# Patient Record
Sex: Male | Born: 1975
Health system: Southern US, Community
[De-identification: ages and names within clinical notes are randomized; demographics above are authoritative.]

## PROBLEM LIST (undated history)

## (undated) DIAGNOSIS — I10 Essential (primary) hypertension: Secondary | ICD-10-CM

## (undated) DIAGNOSIS — I219 Acute myocardial infarction, unspecified: Secondary | ICD-10-CM

## (undated) DIAGNOSIS — E785 Hyperlipidemia, unspecified: Secondary | ICD-10-CM

## (undated) HISTORY — PX: CARDIAC CATHETERIZATION: SHX172

---

## 2000-03-15 ENCOUNTER — Emergency Department (HOSPITAL_COMMUNITY): Admission: EM | Admit: 2000-03-15 | Discharge: 2000-03-15 | Payer: Self-pay | Admitting: Emergency Medicine

## 2000-03-15 ENCOUNTER — Encounter: Payer: Self-pay | Admitting: Emergency Medicine

## 2003-11-16 ENCOUNTER — Ambulatory Visit (HOSPITAL_COMMUNITY): Admission: RE | Admit: 2003-11-16 | Discharge: 2003-11-16 | Payer: Self-pay | Admitting: Urology

## 2020-06-14 ENCOUNTER — Emergency Department (HOSPITAL_BASED_OUTPATIENT_CLINIC_OR_DEPARTMENT_OTHER)
Admission: EM | Admit: 2020-06-14 | Discharge: 2020-06-14 | Disposition: A | Discharge status: Home / Self Care | Payer: 59 | Attending: Emergency Medicine | Admitting: Emergency Medicine

## 2020-06-14 ENCOUNTER — Other Ambulatory Visit: Payer: Self-pay

## 2020-06-14 ENCOUNTER — Emergency Department (HOSPITAL_BASED_OUTPATIENT_CLINIC_OR_DEPARTMENT_OTHER): Payer: 59

## 2020-06-14 ENCOUNTER — Encounter (HOSPITAL_BASED_OUTPATIENT_CLINIC_OR_DEPARTMENT_OTHER): Payer: Self-pay | Admitting: *Deleted

## 2020-06-14 DIAGNOSIS — I10 Essential (primary) hypertension: Secondary | ICD-10-CM | POA: Insufficient documentation

## 2020-06-14 DIAGNOSIS — Z79899 Other long term (current) drug therapy: Secondary | ICD-10-CM | POA: Insufficient documentation

## 2020-06-14 DIAGNOSIS — Z7982 Long term (current) use of aspirin: Secondary | ICD-10-CM | POA: Insufficient documentation

## 2020-06-14 DIAGNOSIS — Z23 Encounter for immunization: Secondary | ICD-10-CM | POA: Insufficient documentation

## 2020-06-14 DIAGNOSIS — S60942A Unspecified superficial injury of right middle finger, initial encounter: Secondary | ICD-10-CM | POA: Diagnosis present

## 2020-06-14 DIAGNOSIS — Y92812 Truck as the place of occurrence of the external cause: Secondary | ICD-10-CM | POA: Diagnosis not present

## 2020-06-14 DIAGNOSIS — W231XXA Caught, crushed, jammed, or pinched between stationary objects, initial encounter: Secondary | ICD-10-CM | POA: Insufficient documentation

## 2020-06-14 DIAGNOSIS — S61212A Laceration without foreign body of right middle finger without damage to nail, initial encounter: Secondary | ICD-10-CM | POA: Insufficient documentation

## 2020-06-14 DIAGNOSIS — Y999 Unspecified external cause status: Secondary | ICD-10-CM | POA: Diagnosis not present

## 2020-06-14 DIAGNOSIS — Y9389 Activity, other specified: Secondary | ICD-10-CM | POA: Diagnosis not present

## 2020-06-14 HISTORY — DX: Hyperlipidemia, unspecified: E78.5

## 2020-06-14 HISTORY — DX: Acute myocardial infarction, unspecified: I21.9

## 2020-06-14 HISTORY — DX: Essential (primary) hypertension: I10

## 2020-06-14 MED ORDER — TETANUS-DIPHTHERIA TOXOIDS TD 5-2 LFU IM INJ: 0.5000 mL | INJECTION | Freq: Once | INTRAMUSCULAR | Status: DC

## 2020-06-14 MED ORDER — BACITRACIN ZINC 500 UNIT/GM EX OINT: TOPICAL_OINTMENT | Freq: Two times a day (BID) | CUTANEOUS | Status: DC

## 2020-06-14 MED ORDER — TETANUS-DIPHTH-ACELL PERTUSSIS 5-2.5-18.5 LF-MCG/0.5 IM SUSP
0.5000 mL | Freq: Once | INTRAMUSCULAR | Status: AC
  Administered 2020-06-14: 0.5 mL via INTRAMUSCULAR

## 2020-06-14 MED ORDER — CEPHALEXIN 500 MG PO CAPS
500.0000 mg | ORAL_CAPSULE | Freq: Two times a day (BID) | ORAL | 0 refills | Status: AC
Start: 2020-06-14 — End: 2020-06-19

## 2020-06-14 MED ORDER — TETANUS-DIPHTH-ACELL PERTUSSIS 5-2.5-18.5 LF-MCG/0.5 IM SUSP
INTRAMUSCULAR | Status: AC
  Filled 2020-06-14: qty 0.5

## 2020-06-14 MED FILL — CEPHALEXIN 500 MG CAPSULE: 500 | 5 days supply | Qty: 10 | Fill #0

## 2020-06-14 NOTE — ED Provider Notes (Signed)
[facility] EMERGENCY DEPARTMENT Provider Note   CSN: [id number] Arrival date & time: 06/14/20  1414     History Chief Complaint  Patient presents with  . Finger Injury    Bruce Hines is a 44 y.o. male.  HPI  Patient is a 44 year old male presented today with right third finger pain and laceration.  He states that this occurred 30 minutes prior to arrival emergency department.  He states that he was loading unloading a truck and crushed his finger between 2 rusted metallic objects of approximately 100 pounds of force behind the crush injury.  He states that the pain is improved somewhat now with time and no other intervention.  He denies any loss of sensation in his fingertips.  States he is uncertain when his last tetanus update was.     Past Medical History:  Diagnosis Date  . Hyperlipidemia   . Hypertension   . MI (myocardial infarction) (HCC)     There are no problems to display for this patient.   Past Surgical History:  Procedure Laterality Date  . CARDIAC CATHETERIZATION         No family history on file.  Social History   Tobacco Use  . Smoking status: Never Smoker  . Smokeless tobacco: Never Used  Substance Use Topics  . Alcohol use: Not Currently  . Drug use: Not Currently    Home Medications Prior to Admission medications   Medication Sig Start Date End Date Taking? Authorizing Provider  aspirin 81 MG EC tablet Take 1 tablet by mouth daily. 01/24/20  Yes [provider]  metoprolol tartrate (LOPRESSOR) 25 MG tablet TAKE 1 TABLET(25 MG) BY MOUTH TWICE DAILY 01/24/20  Yes [provider]  atorvastatin (LIPITOR) 80 MG tablet Take 80 mg by mouth daily. 06/06/20   [provider]  cephALEXin (KEFLEX) 500 MG capsule Take 1 capsule (500 mg total) by mouth 2 (two) times daily for 5 days. 06/14/20 06/19/20  Gailen Shelter, PA  levothyroxine (SYNTHROID) 100 MCG tablet Take 100 mcg by mouth daily. 03/27/20   [provider]    Allergies    Patient has no known allergies.  Review of Systems   Review of Systems  Constitutional: Negative for chills and fever.  HENT: Negative for congestion.   Respiratory: Negative for shortness of breath.   Cardiovascular: Negative for chest pain.  Gastrointestinal: Negative for abdominal pain.  Musculoskeletal: Negative for neck pain.       Finger pain  Skin: Positive for wound.       Laceration    Physical Exam Updated Vital Signs BP 112/64   Pulse 76   Temp 98.7 F (37.1 C)   Resp 18   Ht 6' (1.829 m)   Wt 98.9 kg   SpO2 100%   BMI 29.57 kg/m   Physical Exam Vitals and nursing note reviewed.  Constitutional:      General: He is not in acute distress.    Appearance: Normal appearance. He is not ill-appearing.  HENT:     Head: Normocephalic and atraumatic.  Eyes:     General: No scleral icterus.       Right eye: No discharge.        Left eye: No discharge.     Conjunctiva/sclera: Conjunctivae normal.  Cardiovascular:     Rate and Rhythm: Normal rate.  Pulmonary:     Effort: Pulmonary effort is normal.     Breath sounds: No stridor.  Musculoskeletal:  Comments: For range of motion of right middle fingertip  Skin:    General: Skin is warm and dry.     Capillary Refill: Capillary refill takes less than 2 seconds.     Comments: 2 cm laceration to the dorsum of the right middle fingertip pad  Neurological:     Mental Status: He is alert and oriented to person, place, and time. Mental status is at baseline.     Comments: Sensation intact to sharp and dull sensation of the fingertip.  Psychiatric:        Mood and Affect: Mood normal.        Behavior: Behavior normal.     ED Results / Procedures / Treatments   Labs (all labs ordered are listed, but only abnormal results are displayed) Labs Reviewed - No data to display  EKG None  Radiology DG Finger Middle Right  Result Date: 06/14/2020 CLINICAL DATA:  Right middle  finger crush injury. EXAM: RIGHT MIDDLE FINGER 2+V COMPARISON:  None. FINDINGS: There is no evidence of fracture or dislocation. There is no evidence of arthropathy or other focal bone abnormality. Soft tissues are unremarkable. IMPRESSION: Negative. Electronically Signed   By: Lupita Raider M.D.   On: 06/14/2020 14:54    Procedures .Marland KitchenLaceration Repair  Date/Time: 06/14/2020 4:19 PM Performed by: Gailen Shelter, PA Authorized by: Gailen Shelter, PA   Consent:    Consent obtained:  Verbal   Consent given by:  Patient   Risks discussed:  Infection, need for additional repair, pain, poor cosmetic result and poor wound healing   Alternatives discussed:  No treatment and delayed treatment Universal protocol:    Procedure explained and questions answered to patient or proxy's satisfaction: yes     Relevant documents present and verified: yes     Test results available and properly labeled: yes     Imaging studies available: yes     Required blood products, implants, devices, and special equipment available: yes     Site/side marked: yes     Immediately prior to procedure, a time out was called: yes     Patient identity confirmed:  Verbally with patient Anesthesia (see MAR for exact dosages):    Anesthesia method:  Local infiltration   Local anesthetic:  Bupivacaine 0.5% w/o epi Laceration details:    Location:  Finger   Finger location:  R long finger   Length (cm):  2 Repair type:    Repair type:  Simple Exploration:    Hemostasis achieved with:  Direct pressure   Wound exploration: wound explored through full range of motion     Wound extent: no foreign bodies/material noted and no tendon damage noted     Contaminated: no   Treatment:    Area cleansed with:  Saline   Amount of cleaning:  Standard   Irrigation solution:  Sterile saline   Irrigation volume:  200   Irrigation method:  Pressure wash   Visualized foreign bodies/material removed: no   Skin repair:    Repair  method:  Sutures   Suture size:  4-0   Suture material:  Prolene   Suture technique:  Simple interrupted   Number of sutures:  4 Approximation:    Approximation:  Close Post-procedure details:    Dressing:  Antibiotic ointment, non-adherent dressing and bulky dressing   Patient tolerance of procedure:  Tolerated well, no immediate complications   (including critical care time)  Medications Ordered in ED2 Medications  Tdap (BOOSTRIX)  5-2.5-18.5 LF-MCG/0.5 injection (has no administration in time range)  bacitracin ointment (has no administration in time range)  Tdap (BOOSTRIX) injection 0.5 mL (0.5 mLs Intramuscular Given 06/14/20 1432)    ED Course  I have reviewed the triage vital signs and the nursing notes.  Pertinent labs & imaging results that were available during my care of the patient were reviewed by me and considered in my medical decision making (see chart for details).    MDM Rules/Calculators/A&P                          Pressure irrigation performed. Wound explored and base of wound visualized in a bloodless field without evidence of foreign body.  Laceration occurred < 8 hours prior to repair which was well tolerated. Tdap updated.  Pt has no comorbidities to effect normal wound healing. Pt discharged with antibiotics due to dirty wound (even with cleaning) Discussed suture home care with patient and answered questions. Pt to follow-up for wound check and suture removal in 7 days; they are to return to the ED sooner for signs of infection. Pt is hemodynamically stable with no complaints prior to dc.   Final Clinical Impression(s) / ED Diagnoses Final diagnoses:  Laceration of right middle finger without foreign body without damage to nail, initial encounter    Rx / DC Orders ED Discharge Orders         Ordered    cephALEXin (KEFLEX) 500 MG capsule  2 times daily     Discontinue  Reprint     06/14/20 1559           Gailen Shelter, PA 06/14/20 1936      Melene Plan, DO 06/14/20 1944

## 2020-06-14 NOTE — Medical Student Note (Signed)
[facility] Provider Student Note For educational purposes for Medical, PA and NP students only and not part of the legal medical record.   CSN: [id number] Arrival date & time: 06/14/20  1414      History   Chief Complaint Chief Complaint  Patient presents with  . Finger Injury    HPI Bruce Hines is a 44 y.o. male presenting with right third finger laceration. 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 The laceration is about 4 cm in length and at the dorsal aspect of his third finger. There is no obvious bony deformity of the finger or his hand. Pt is currently soaking finger in betadine.  He admits to receiving tetanus shot in ED and does not remember when he received his last tetanus shot prior to today. He says that he has history of MI and takes a daily ASA. He has maintained RoM and sensation in his finger and is in no acute distress.  HPI  Past Medical History:  Diagnosis Date  . Hyperlipidemia   . Hypertension   . MI (myocardial infarction) (HCC)     There are no problems to display for this patient.   Past Surgical History:  Procedure Laterality Date  . CARDIAC CATHETERIZATION         Home Medications    Prior to Admission medications   Not on File    Family History No family history on file.  Social History Social History   Tobacco Use  . Smoking status: Never Smoker  . Smokeless tobacco: Never Used  Substance Use Topics  . Alcohol use: Not Currently  . Drug use: Not Currently     Allergies   Patient has no known allergies.   Review of Systems Review of Systems  Respiratory: Negative for cough and shortness of breath.   Cardiovascular: Negative for chest pain and palpitations.  Skin: Positive for wound (right third finger laceration). Negative for color change, pallor and rash.  Neurological: Negative for syncope and weakness.      Physical Exam Updated Vital Signs BP (!) 161/89   Pulse 76   Temp 98.7 F (37.1 C)   Resp 18   Ht 6' (1.829 m)   Wt 98.9 kg   SpO2 100%   BMI 29.57 kg/m   Physical Exam Constitutional:      General: He is not in acute distress.    Appearance: Normal appearance. He is not ill-appearing or diaphoretic.  HENT:     Head: Normocephalic and atraumatic.  Cardiovascular:     Rate and Rhythm: Normal rate and regular rhythm.     Pulses: Normal pulses.     Heart sounds: Normal heart sounds.  Pulmonary:     Effort: Pulmonary effort is normal.     Breath sounds: Normal breath sounds.  Abdominal:     General: Abdomen is flat.     Palpations: Abdomen is soft.  Musculoskeletal:        General: No swelling. Normal range of motion.     Cervical back: Normal range of motion and neck supple.  Skin:    General: Skin is warm and dry.     Comments: Right third finger laceration about 4 cm in length and dorsal and distal aspect of finger   Neurological:     Mental Status: He is alert and oriented to person, place, and time.  Sensory: No sensory deficit.      ED Treatments / Results  Labs (all labs ordered are listed, but only abnormal results are displayed) Labs Reviewed - No data to display  EKG  Radiology DG Finger Middle Right  Result Date: 06/14/2020 CLINICAL DATA:  Right middle finger crush injury. EXAM: RIGHT MIDDLE FINGER 2+V COMPARISON:  None. FINDINGS: There is no evidence of fracture or dislocation. There is no evidence of arthropathy or other focal bone abnormality. Soft tissues are unremarkable. IMPRESSION: Negative. Electronically Signed   By: Lupita Raider M.D.   On: 06/14/2020 14:54    Procedures .Marland KitchenLaceration Repair  Date/Time: 06/14/2020 3:58 PM Performed by: Pollyann Savoy, MD Authorized by: Pollyann Savoy, MD   Consent:    Consent obtained:  Verbal   Consent given by:  Patient   Risks discussed:  Infection, pain, need for additional  repair and poor wound healing   Alternatives discussed:  No treatment Anesthesia (see MAR for exact dosages):    Anesthesia method:  Local infiltration   Local anesthetic:  Lidocaine 1% w/o epi Laceration details:    Location:  Finger   Finger location:  R long finger   Length (cm):  3   Depth (mm):  3 Repair type:    Repair type:  Simple Pre-procedure details:    Preparation:  Patient was prepped and draped in usual sterile fashion and imaging obtained to evaluate for foreign bodies Exploration:    Hemostasis achieved with:  Epinephrine and tourniquet   Wound extent: no foreign bodies/material noted   Treatment:    Area cleansed with:  Saline and Betadine   Amount of cleaning:  Standard   Irrigation solution:  Sterile saline   Irrigation volume:  2000 cc Skin repair:    Repair method:  Sutures   Suture size:  4-0   Suture material:  Prolene   Suture technique:  Simple interrupted   Number of sutures:  4 Approximation:    Approximation:  Close Post-procedure details:    Dressing:  Antibiotic ointment and adhesive bandage   Patient tolerance of procedure:  Tolerated well, no immediate complications   (including critical care time)  Medications Ordered in ED Medications  Tdap (BOOSTRIX) 5-2.5-18.5 LF-MCG/0.5 injection (has no administration in time range)  Tdap (BOOSTRIX) injection 0.5 mL (0.5 mLs Intramuscular Given 06/14/20 1432)     Initial Impression / Assessment and Plan / ED Course  I have reviewed the triage vital signs and the nursing notes.  Pertinent labs & imaging results that were available during my care of the patient were reviewed by me and considered in my medical decision making (see chart for details).    Radiology report of right middle finger XR reports no acute fracture or dislocation. Pt will receive laceration repair while in ED along with local anesthesia with lidocaine without epi. He will be advised to take tylenol or NSAIDs OTC for pain while  at home.  Pt updated on radiology results negative for fracture and he elected for suture repair of finger. Local wound irrigation with 2000 cc NS and local digital nerve block with lidocaine WO epi. Pt had 4 4-0 prolene sutures placed in simple interrupted fashion and tolerated procedure well w/o complaints. Used tourniquet during procedure and was removed after procedure. Post tourniquet removal pt required direct pressure for hemostasis. The pt will be discharged with short course of Keflex to cover infection given mechanism of injury- dirty work [employer].  Pt will be discharged  home with abx and educated that sutures will need removed in 10-14 days. Pt educated to return to ED if signs of infection are present such as erythema, warmth to touch, purulent discharge.   Final Clinical Impressions(s) / ED Diagnoses   Final diagnoses:  None    New Prescriptions New Prescriptions   No medications on file

## 2020-06-14 NOTE — ED Notes (Signed)
Patient transported to X-ray 

## 2020-06-14 NOTE — ED Triage Notes (Signed)
Pt c/o right 3rd finger crushing injury with lac x 30 mins ago

## 2020-06-14 NOTE — Discharge Instructions (Addendum)
Sutured repair Keep the laceration site dry for the next 24 hours and leave the dressing in place. After 24 hours you may remove the dressing and gently clean the laceration site with antibacterial soap and warm water. Do not scrub the area. Do not soak the area and water for long periods of time. Don't use hydrogen peroxide, iodine-based solutions, or alcohol, which can slow healing, and will probably be painful! Apply topical bacitracin 1-2 times per day for the next 3-5 days. Return to the emergency department in 7-10 days for removal of the sutures.  You should return sooner for any signs of infection which would include increased redness around the wound, increased swelling, new drainage of yellow pus.   I have low suspicion that you have an infection however please take the antibiotic I prescribed you twice daily for the next 5 days.  You may use Tylenol 1000 mg every 6 hours.

## 2020-06-14 NOTE — ED Notes (Signed)
ED Provider at bedside. 

## 2020-12-03 IMAGING — CR DG FINGER MIDDLE 2+V*R*
3 series · 3 of 3 positions shown · non-contrast
Comparison: None.

CLINICAL DATA: Right middle finger crush injury.

EXAM:
RIGHT MIDDLE FINGER 2+V

[x finger pa right]
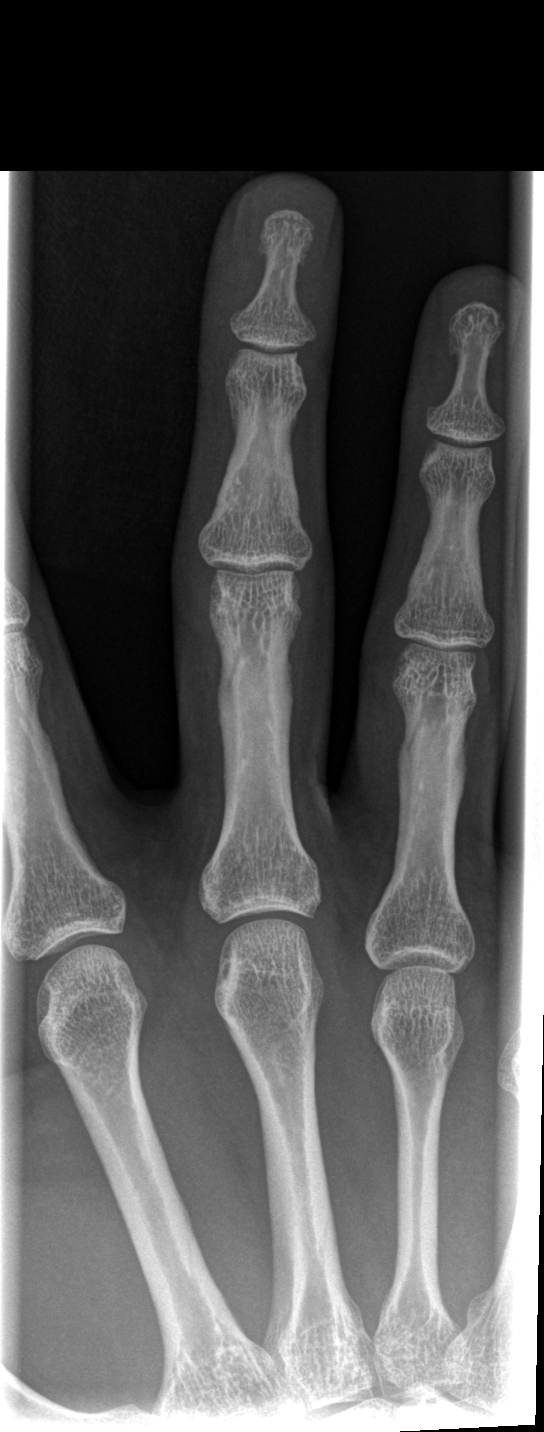

[x finger obl. right]
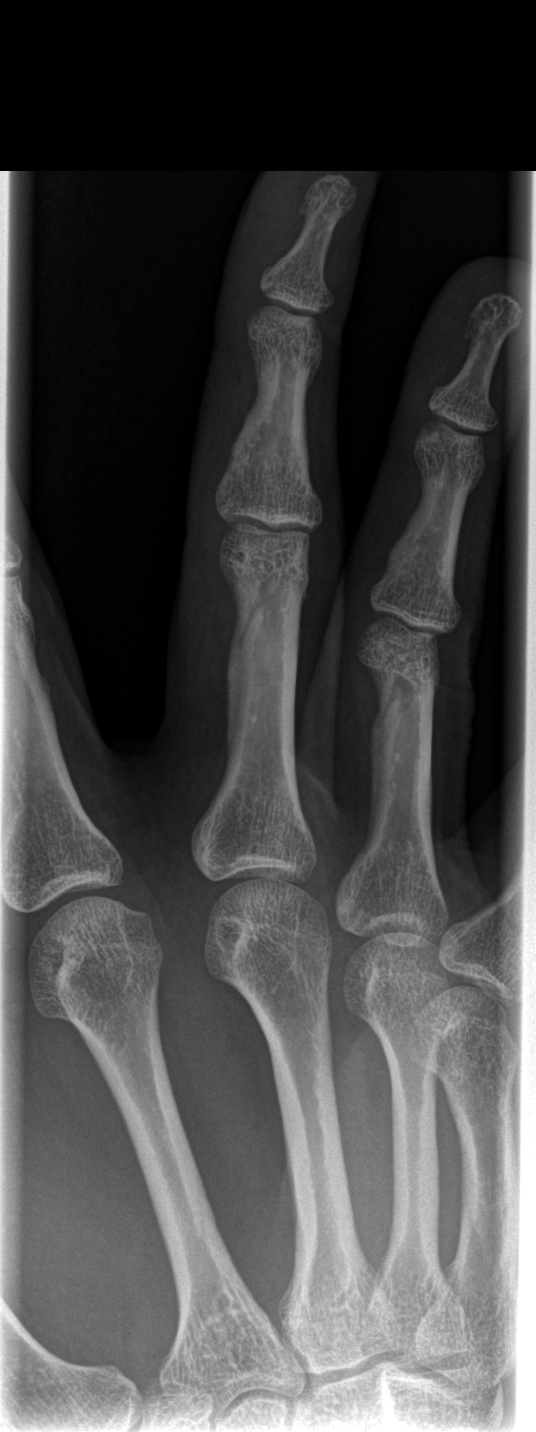

[x finger lateral right]
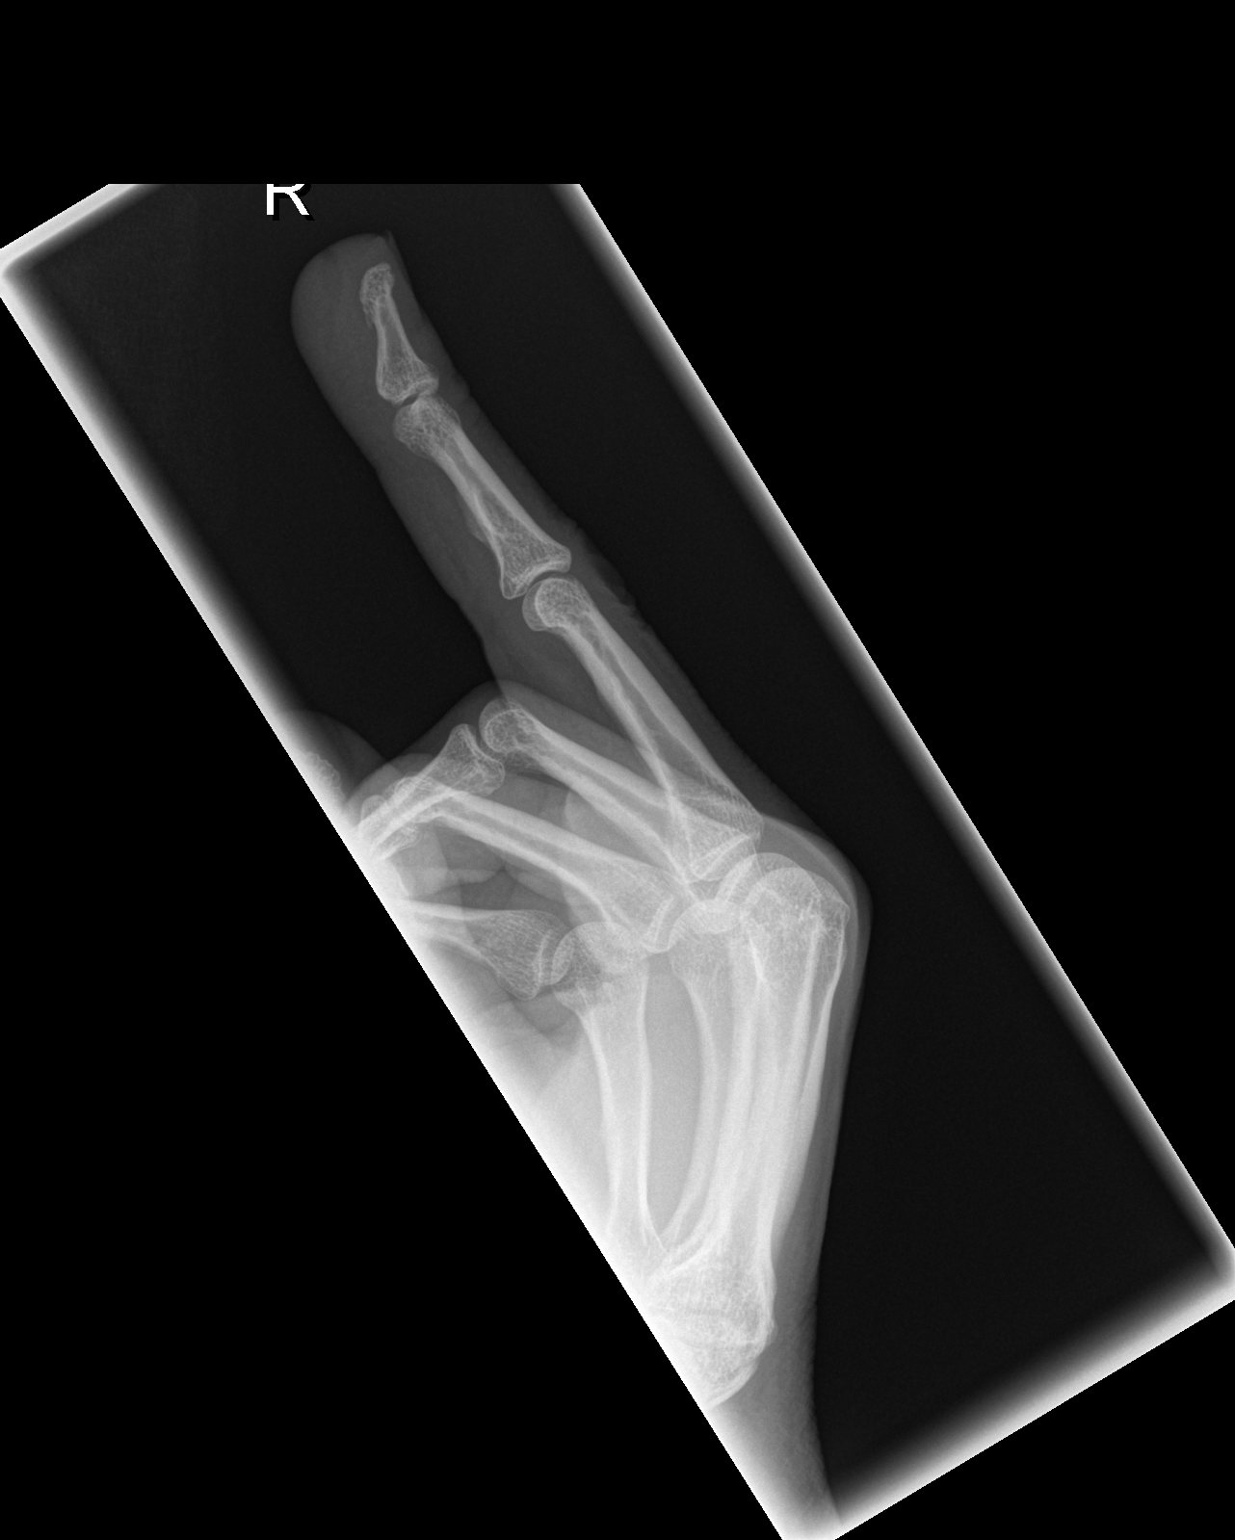

[3 of 3 positions shown; findings below may reference images not displayed]

FINDINGS: There is no evidence of fracture or dislocation. There is no
evidence of arthropathy or other focal bone abnormality. Soft
tissues are unremarkable.
IMPRESSION: Negative.
